# Patient Record
Sex: Female | Born: 1960 | Race: White | Hispanic: No | Marital: Single | Smoking: Current every day smoker
Health system: Southern US, Community
[De-identification: ages and names within clinical notes are randomized; demographics above are authoritative.]

## PROBLEM LIST (undated history)

## (undated) DIAGNOSIS — I10 Essential (primary) hypertension: Secondary | ICD-10-CM

## (undated) HISTORY — PX: APPENDECTOMY: SHX54

## (undated) HISTORY — PX: NECK SURGERY: SHX720

---

## 1998-06-29 ENCOUNTER — Emergency Department (HOSPITAL_COMMUNITY): Admission: EM | Admit: 1998-06-29 | Discharge: 1998-06-29 | Payer: Self-pay | Admitting: Emergency Medicine

## 2000-05-27 ENCOUNTER — Encounter (INDEPENDENT_AMBULATORY_CARE_PROVIDER_SITE_OTHER): Payer: Self-pay | Admitting: Specialist

## 2000-05-27 ENCOUNTER — Other Ambulatory Visit: Admission: RE | Admit: 2000-05-27 | Discharge: 2000-05-27 | Payer: Self-pay | Admitting: Obstetrics & Gynecology

## 2000-06-24 ENCOUNTER — Ambulatory Visit (HOSPITAL_COMMUNITY): Admission: RE | Admit: 2000-06-24 | Discharge: 2000-06-24 | Payer: Self-pay | Admitting: Obstetrics & Gynecology

## 2000-06-24 ENCOUNTER — Encounter (INDEPENDENT_AMBULATORY_CARE_PROVIDER_SITE_OTHER): Payer: Self-pay

## 2000-09-27 ENCOUNTER — Other Ambulatory Visit: Admission: RE | Admit: 2000-09-27 | Discharge: 2000-09-27 | Payer: Self-pay | Admitting: Obstetrics & Gynecology

## 2000-12-28 ENCOUNTER — Other Ambulatory Visit: Admission: RE | Admit: 2000-12-28 | Discharge: 2000-12-28 | Payer: Self-pay | Admitting: Obstetrics & Gynecology

## 2002-02-27 ENCOUNTER — Encounter: Payer: Self-pay | Admitting: Family Medicine

## 2002-02-27 ENCOUNTER — Encounter: Admission: RE | Admit: 2002-02-27 | Discharge: 2002-02-27 | Payer: Self-pay | Admitting: Family Medicine

## 2003-04-17 ENCOUNTER — Other Ambulatory Visit: Admission: RE | Admit: 2003-04-17 | Discharge: 2003-04-17 | Payer: Self-pay | Admitting: Obstetrics & Gynecology

## 2004-07-02 ENCOUNTER — Other Ambulatory Visit: Admission: RE | Admit: 2004-07-02 | Discharge: 2004-07-02 | Payer: Self-pay | Admitting: Obstetrics & Gynecology

## 2004-10-03 ENCOUNTER — Encounter: Admission: RE | Admit: 2004-10-03 | Discharge: 2004-10-03 | Payer: Self-pay | Admitting: Family Medicine

## 2005-04-30 ENCOUNTER — Encounter: Admission: RE | Admit: 2005-04-30 | Discharge: 2005-04-30 | Payer: Self-pay | Admitting: Family Medicine

## 2005-07-28 ENCOUNTER — Other Ambulatory Visit: Admission: RE | Admit: 2005-07-28 | Discharge: 2005-07-28 | Payer: Self-pay | Admitting: Obstetrics & Gynecology

## 2008-02-28 ENCOUNTER — Encounter: Admission: RE | Admit: 2008-02-28 | Discharge: 2008-02-28 | Payer: Self-pay | Admitting: Family Medicine

## 2009-07-23 ENCOUNTER — Ambulatory Visit: Payer: Self-pay | Admitting: Family Medicine

## 2009-07-23 DIAGNOSIS — M546 Pain in thoracic spine: Secondary | ICD-10-CM

## 2009-07-23 DIAGNOSIS — I10 Essential (primary) hypertension: Secondary | ICD-10-CM | POA: Insufficient documentation

## 2010-10-04 ENCOUNTER — Encounter: Payer: Self-pay | Admitting: Family Medicine

## 2011-01-30 NOTE — Op Note (Signed)
[facility]  Patient:    Kathryn Strong, Kathryn Strong                     MRN: [id number] Proc. Date: 06/24/00 Adm. Date:  [id number] Attending:  Minette Headland                           Operative Report  PREOPERATIVE DIAGNOSES:       1.  History of pelvic endometriosis.                               2.  Recurrent chronic right pelvic pain.                               3.  Previous history of chronic appendicitis,                                   with previous appendectomy.                               4.  Recent negative colposcopy, with suggestion                                   of high-grade lesions.  POSTOPERATIVE DIAGNOSES:      1.  History of pelvic endometriosis.                               2.  Recurrent chronic right pelvic pain.                               3.  Previous history of chronic appendicitis,                                   with previous appendectomy.                               4.  Recent negative colposcopy, with suggestion                                   of high-grade lesions.  OPERATIVE PROCEDURE:          1.  Laparoscopy.                               2.  Uterosacral nerve ablation with bipolar                                   coagulation of uterosacral ligaments;                                   followed by cold  myoconization of cervix                                   and fractional D&C.  SURGEON:                      Freddy Finner, M.D.  ANESTHESIA:                   General endotracheal.  INTRAOPERATIVE BLOOD LOSS:    100 cc or less.  INTRAOPERATIVE COMPLICATIONS: None.  INTRAOPERATIVE FINDINGS:      The uterus itself was enlarged and slightly irregular, most consistent with adenomyosis. There was no active evidence of endometriosis and no apparent significant adhesions within the abdomen.  The patient was admitted on the morning of surgery and brought to the operating room and there placed under  adequate general endotracheal anesthesia.  She was placed in the dorsolithotomy position using the [employer].  Betadine prep of the abdomen, perineum and vagina was carried out in the usual fashion.  The bladder was evacuated with [facility] catheter. Hulka tenaculum was attached to the cervix.  Sterile drapes were applied.  Two small incisions were made; one to the umbilicus and one just above the symphysis.  To the upper incision a 10 mm trocar was introduced while holding on the anterior abdominal wall manually.  Direct inspection revealed adequate placement, with no evidence of injury on entry.  A pneumoperitoneum was allowed to accumulate with carbon dioxide gas.  A 5 mm trocar was placed in the lower incision.  Careful and systematic examination of pelvic and abdominal contents was carried out, as noted above.  The findings were negative, except for enlargement of the uterus.  The uterosacral ligaments were pulled and stretched using the Hulka tenaculum and fulgurated at that junction with the bipolar Carpentier forceps through the operating chamber of the laparoscope.  The intra-abdominal portion of the procedure was then terminated.  Gas was allowed to escape from the abdomen.  A local injection of 0.5% Marcaine was done at the incision sites.  The incisions were closed with interrupted subcuticular sutures of 3-0 Dexon.  Steri-Strips were applied to the lower incision.  Attention was then turned vaginally.  Posterior weighted retractor was placed. Hulka tenaculum was removed.  Jackson retractor was used to retract the anterior and lateral fascial walls.  The cervix was grasped with a sharp single-tooth tenaculum.  Cone biopsy was circumscribed, including the entire squamocolumnar junction; carried to a depth of approximately 1.25 cm into the endocervical canal.  General endocervical curettage was carried out.  The cervix was dilated.  The uterus was sounded to 8 cm.   General thorough endometrial curettage was undertaken.  A total of three specimens were submitted.  The angles of the cervix were sutured with 0 Monocryl in a figure-of-eight fashion.  A figure-of-eight was placed in the anterior and posterior cervical lip.  Surgicel was placed into the endocervical canal.  A single interrupted suture was placed to hold the Surgicel in place from anterior to posterior lip of the cervix.  The procedure was terminated.  Hemostasis was adequate.  The patient was taken to the recovery room in good condition.  She was given Vicodin to be taken for postoperative pain.  She is to have no vaginal entry until followed up in the office in approximately two weeks.  She was given routine outpatient  surgical instructions. DD:  06/24/00 TD:  06/24/00 Job: [id number]

## 2011-03-31 ENCOUNTER — Other Ambulatory Visit: Payer: Self-pay | Admitting: Obstetrics & Gynecology

## 2011-03-31 DIAGNOSIS — N6322 Unspecified lump in the left breast, upper inner quadrant: Secondary | ICD-10-CM

## 2011-04-03 ENCOUNTER — Ambulatory Visit
Admission: RE | Admit: 2011-04-03 | Discharge: 2011-04-03 | Disposition: A | Discharge status: Home / Self Care | Payer: BC Managed Care – PPO | Source: Ambulatory Visit | Attending: Obstetrics & Gynecology | Admitting: Obstetrics & Gynecology

## 2011-04-03 ENCOUNTER — Other Ambulatory Visit: Payer: Self-pay | Admitting: Obstetrics & Gynecology

## 2011-04-03 DIAGNOSIS — N6322 Unspecified lump in the left breast, upper inner quadrant: Secondary | ICD-10-CM

## 2011-04-03 DIAGNOSIS — N6311 Unspecified lump in the right breast, upper outer quadrant: Secondary | ICD-10-CM

## 2013-06-21 ENCOUNTER — Ambulatory Visit
Admission: RE | Admit: 2013-06-21 | Discharge: 2013-06-21 | Disposition: A | Discharge status: Home / Self Care | Payer: BC Managed Care – PPO | Source: Ambulatory Visit | Attending: Family Medicine | Admitting: Family Medicine

## 2013-06-21 ENCOUNTER — Other Ambulatory Visit: Payer: Self-pay | Admitting: Family Medicine

## 2013-06-21 DIAGNOSIS — R05 Cough: Secondary | ICD-10-CM

## 2013-10-24 ENCOUNTER — Other Ambulatory Visit: Payer: Self-pay | Admitting: Family Medicine

## 2013-10-24 ENCOUNTER — Ambulatory Visit
Admission: RE | Admit: 2013-10-24 | Discharge: 2013-10-24 | Disposition: A | Discharge status: Home / Self Care | Payer: BC Managed Care – PPO | Source: Ambulatory Visit | Attending: Family Medicine | Admitting: Family Medicine

## 2013-10-24 DIAGNOSIS — T1490XA Injury, unspecified, initial encounter: Secondary | ICD-10-CM

## 2014-04-26 ENCOUNTER — Encounter (HOSPITAL_COMMUNITY): Payer: Self-pay | Admitting: Emergency Medicine

## 2014-04-26 ENCOUNTER — Emergency Department (HOSPITAL_COMMUNITY)
Admission: EM | Admit: 2014-04-26 | Discharge: 2014-04-26 | Disposition: A | Discharge status: Home / Self Care | Payer: BC Managed Care – PPO | Source: Home / Self Care | Attending: Emergency Medicine | Admitting: Emergency Medicine

## 2014-04-26 DIAGNOSIS — R319 Hematuria, unspecified: Secondary | ICD-10-CM

## 2014-04-26 DIAGNOSIS — M546 Pain in thoracic spine: Secondary | ICD-10-CM

## 2014-04-26 HISTORY — DX: Essential (primary) hypertension: I10

## 2014-04-26 LAB — POCT URINALYSIS DIP (DEVICE)
Bilirubin Urine: NEGATIVE
GLUCOSE, UA: NEGATIVE mg/dL
KETONES UR: NEGATIVE mg/dL
Leukocytes, UA: NEGATIVE
Nitrite: NEGATIVE
Protein, ur: NEGATIVE mg/dL
SPECIFIC GRAVITY, URINE: 1.01 (ref 1.005–1.030)
Urobilinogen, UA: 0.2 mg/dL (ref 0.0–1.0)
pH: 7 (ref 5.0–8.0)

## 2014-04-26 LAB — POCT PREGNANCY, URINE: PREG TEST UR: NEGATIVE

## 2014-04-26 MED ORDER — DICLOFENAC SODIUM 50 MG PO TBEC: 50.0000 mg | DELAYED_RELEASE_TABLET | Freq: Two times a day (BID) | ORAL | Status: AC

## 2014-04-26 MED ORDER — CYCLOBENZAPRINE HCL 5 MG PO TABS
5.0000 mg | ORAL_TABLET | Freq: Three times a day (TID) | ORAL | Status: AC | PRN
Start: 2014-04-26 — End: ?

## 2014-04-26 NOTE — ED Provider Notes (Signed)
CSN: [id number]     Arrival date & time 04/26/14  1122 History   First MD Initiated Contact with Patient 04/26/14 1152     Chief Complaint  Patient presents with  . Back Pain   (Consider location/radiation/quality/duration/timing/severity/associated sxs/prior Treatment) HPI Comments: Patient reports abrupt onset of an aching left flank pain on 04/24/2014. Reports the pain to have been intense and "like a spasm." Resolved spontaneously on that day and then reports a second and third episode of same over past 48 hours. States pain radiates around to the front of her left hip. Denies recent injury, fever, nausea, vomiting, diarrhea, hematuria, constipation, rash. Denies chest pain, dyspnea, cough. Patient is a smoker and does not report history of kidney stones. Has not taken medication at home for pain. States she called her PCP office, but was informed her MD was out of office and she did not want to see another provider in the office, so she decided to come here.   The history is provided by the patient.    Past Medical History  Diagnosis Date  . Hypertension    Past Surgical History  Procedure Laterality Date  . Appendectomy    . Cesarean section    . Neck surgery     No family history on file. History  Substance Use Topics  . Smoking status: Current Every Day Smoker  . Smokeless tobacco: Not on file  . Alcohol Use: Yes   OB History   Grav Para Term Preterm Abortions TAB SAB Ect Mult Living                 Review of Systems  Constitutional: Negative.   HENT: Negative.   Eyes: Negative.   Respiratory: Negative.   Cardiovascular: Negative.   Gastrointestinal: Negative.   Endocrine: Negative for polydipsia, polyphagia and polyuria.  Genitourinary: Positive for flank pain. Negative for dysuria, urgency, frequency, hematuria, decreased urine volume, vaginal bleeding, vaginal discharge, enuresis, difficulty urinating, vaginal pain, pelvic pain and dyspareunia.  Musculoskeletal:  Positive for back pain.  Skin: Negative.   Neurological: Negative.     Allergies  Sulfonamide derivatives  Home Medications   Prior to Admission medications   Medication Sig Start Date End Date Taking? Authorizing Provider  ibuprofen (ADVIL,MOTRIN) 200 MG tablet Take 200 mg by mouth every 6 (six) hours as needed.   Yes Historical Provider, MD  LOSARTAN POTASSIUM PO Take by mouth.   Yes Historical Provider, MD  cyclobenzaprine (FLEXERIL) 5 MG tablet Take 1 tablet (5 mg total) by mouth 3 (three) times daily as needed for muscle spasms. 04/26/14   Mathis FareJennifer Lee H Truth Barot, PA  diclofenac (VOLTAREN) 50 MG EC tablet Take 1 tablet (50 mg total) by mouth 2 (two) times daily. As needed for pain 04/26/14   Jess BartersJennifer Lee H Mart Colpitts, PA   BP 158/75  Pulse 78  Temp(Src) 98.4 F (36.9 C) (Oral)  Resp 16  SpO2 96% Physical Exam  Nursing note and vitals reviewed. Constitutional: She is oriented to person, place, and time. She appears well-developed and well-nourished.  HENT:  Head: Normocephalic and atraumatic.  Eyes: Conjunctivae are normal. No scleral icterus.  Neck: Normal range of motion. Neck supple.  Cardiovascular: Normal rate, regular rhythm and normal heart sounds.   Pulmonary/Chest: Effort normal and breath sounds normal. No respiratory distress. She has no wheezes.  Abdominal: Soft. Normal appearance and bowel sounds are normal. There is no tenderness. There is no CVA tenderness.  Musculoskeletal: Normal range of motion.  Back:  CSM exam of both lower extremities is normal.   Neurological: She is alert and oriented to person, place, and time.  Skin: Skin is warm and dry. No rash noted. No erythema.  Psychiatric: She has a normal mood and affect. Her behavior is normal.    ED Course  Procedures (including critical care time) Labs Review Labs Reviewed  POCT URINALYSIS DIP (DEVICE) - Abnormal; Notable for the following:    Hgb urine dipstick TRACE (*)    All other components  within normal limits  POCT PREGNANCY, URINE    Imaging Review No results found.   MDM   1. BACK PAIN, THORACIC REGION, LEFT   2. Hematuria    Exam unremarkable. UPT negative. UA with hematuria. With hematuria (without evidence of infection), patient may have experienced recent passage of kidney stone. Advised to strain all urine over next few days, take flexeril and diclofenac as prescribed and follow up with PCP if no improvement. Patient voices understanding regarding urgent follow up if fever, gross hematuria, chest pain, dyspnea or development of blistering rash.   Ria Clock, [place] 04/26/14 [phone]

## 2014-04-26 NOTE — ED Notes (Signed)
Back pain onset Tuesday 8/11.  Unknown injury.  Pain in middle of back and then radiates around left side, lower ribcage.  Patient reports pain is constant, but has had 3 episodes of severe pain that doubbled her over.  Left ear feels stuffy and has been so for 1 1/2 weeks

## 2014-04-26 NOTE — Discharge Instructions (Signed)
Strain all urine. Medication as prescribed. If pain becomes suddenly worse or severe, you develop fever, visible blood in your urine or are unable pass your urine, report to your nearest ER for evaluation. If symptoms do not improve, please follow up with your doctor.  Back Pain, Adult Low back pain is very common. About 1 in 5 people have back pain.The cause of low back pain is rarely dangerous. The pain often gets better over time.About half of people with a sudden onset of back pain feel better in just 2 weeks. About 8 in 10 people feel better by 6 weeks.  CAUSES Some common causes of back pain include:  Strain of the muscles or ligaments supporting the spine.  Wear and tear (degeneration) of the spinal discs.  Arthritis.  Direct injury to the back. DIAGNOSIS Most of the time, the direct cause of low back pain is not known.However, back pain can be treated effectively even when the exact cause of the pain is unknown.Answering your caregiver's questions about your overall health and symptoms is one of the most accurate ways to make sure the cause of your pain is not dangerous. If your caregiver needs more information, he or she may order lab work or imaging tests (X-rays or MRIs).However, even if imaging tests show changes in your back, this usually does not require surgery. HOME CARE INSTRUCTIONS For many people, back pain returns.Since low back pain is rarely dangerous, it is often a condition that people can learn to [facility] LLCmanageon their own.   Remain active. It is stressful on the back to sit or stand in one place. Do not sit, drive, or stand in one place for more than 30 minutes at a time. Take short walks on level surfaces as soon as pain allows.Try to increase the length of time you walk each day.  Do not stay in bed.Resting more than 1 or 2 days can delay your recovery.  Do not avoid exercise or work.Your body is made to move.It is not dangerous to be active, even though your  back may hurt.Your back will likely heal faster if you return to being active before your pain is gone.  Pay attention to your body when you bend and lift. Many people have less discomfortwhen lifting if they bend their knees, keep the load close to their bodies,and avoid twisting. Often, the most comfortable positions are those that put less stress on your recovering back.  Find a comfortable position to sleep. Use a firm mattress and lie on your side with your knees slightly bent. If you lie on your back, put a pillow under your knees.  Only take over-the-counter or prescription medicines as directed by your caregiver. Over-the-counter medicines to reduce pain and inflammation are often the most helpful.Your caregiver may prescribe muscle relaxant drugs.These medicines help dull your pain so you can more quickly return to your normal activities and healthy exercise.  Put ice on the injured area.  Put ice in a plastic bag.  Place a towel between your skin and the bag.  Leave the ice on for 15-20 minutes, 03-04 times a day for the first 2 to 3 days. After that, ice and heat may be alternated to reduce pain and spasms.  Ask your caregiver about trying back exercises and gentle massage. This may be of some benefit.  Avoid feeling anxious or stressed.Stress increases muscle tension and can worsen back pain.It is important to recognize when you are anxious or stressed and learn ways to  manage it.Exercise is a great option. SEEK MEDICAL CARE IF:  You have pain that is not relieved with rest or medicine.  You have pain that does not improve in 1 week.  You have new symptoms.  You are generally not feeling well. SEEK IMMEDIATE MEDICAL CARE IF:   You have pain that radiates from your back into your legs.  You develop new bowel or bladder control problems.  You have unusual weakness or numbness in your arms or legs.  You develop nausea or vomiting.  You develop abdominal  pain.  You feel faint. Document Released: 08/31/2005 Document Revised: 03/01/2012 Document Reviewed: 01/02/2014 [facility] Patient Information 2015 [place], [place]. This information is not intended to replace advice given to you by your health care provider. Make sure you discuss any questions you have with your health care provider.  Hematuria Hematuria is blood in your urine. It can be caused by a bladder infection, kidney infection, prostate infection, kidney stone, or cancer of your urinary tract. Infections can usually be treated with medicine, and a kidney stone usually will pass through your urine. If neither of these is the cause of your hematuria, further workup to find out the reason may be needed. It is very important that you tell your health care provider about any blood you see in your urine, even if the blood stops without treatment or happens without causing pain. Blood in your urine that happens and then stops and then happens again can be a symptom of a very serious condition. Also, pain is not a symptom in the initial stages of many urinary cancers. HOME CARE INSTRUCTIONS   Drink lots of fluid, 3-4 quarts a day. If you have been diagnosed with an infection, cranberry juice is especially recommended, in addition to large amounts of water.  Avoid caffeine, tea, and carbonated beverages because they tend to irritate the bladder.  Avoid alcohol because it may irritate the prostate.  Take all medicines as directed by your health care provider.  If you were prescribed an antibiotic medicine, finish it all even if you start to feel better.  If you have been diagnosed with a kidney stone, follow your health care provider's instructions regarding straining your urine to catch the stone.  Empty your bladder often. Avoid holding urine for long periods of time.  After a bowel movement, women should cleanse front to back. Use each tissue only once.  Empty your bladder before and after  sexual intercourse if you are a female. SEEK MEDICAL CARE IF:  You develop back pain.  You have a fever.  You have a feeling of sickness in your stomach (nausea) or vomiting.  Your symptoms are not better in 3 days. Return sooner if you are getting worse. SEEK IMMEDIATE MEDICAL CARE IF:   You develop severe vomiting and are unable to keep the medicine down.  You develop severe back or abdominal pain despite taking your medicines.  You begin passing a large amount of blood or clots in your urine.  You feel extremely weak or faint, or you pass out. MAKE SURE YOU:   Understand these instructions.  Will watch your condition.  Will get help right away if you are not doing well or get worse. Document Released: 08/31/2005 Document Revised: 01/15/2014 Document Reviewed: 05/01/2013 [facility] Patient Information 2015 [place], [place]. This information is not intended to replace advice given to you by your health care provider. Make sure you discuss any questions you have with your health care provider.

## 2014-04-27 NOTE — ED Provider Notes (Signed)
Medical screening examination/treatment/procedure(s) were performed by non-physician practitioner and as supervising physician I was immediately available for consultation/collaboration.  Brecken Dewoody, M.D.  Arnie Maiolo C Dyshon Philbin, MD 04/27/14 2147 

## 2014-05-09 ENCOUNTER — Other Ambulatory Visit: Payer: Self-pay | Admitting: Family Medicine

## 2014-05-09 DIAGNOSIS — R109 Unspecified abdominal pain: Secondary | ICD-10-CM

## 2014-05-10 ENCOUNTER — Ambulatory Visit
Admission: RE | Admit: 2014-05-10 | Discharge: 2014-05-10 | Disposition: A | Discharge status: Home / Self Care | Payer: BC Managed Care – PPO | Source: Ambulatory Visit | Attending: Family Medicine | Admitting: Family Medicine

## 2014-05-10 DIAGNOSIS — R109 Unspecified abdominal pain: Secondary | ICD-10-CM

## 2015-01-16 ENCOUNTER — Emergency Department (INDEPENDENT_AMBULATORY_CARE_PROVIDER_SITE_OTHER)
Admission: EM | Admit: 2015-01-16 | Discharge: 2015-01-16 | Disposition: A | Discharge status: Home / Self Care | Payer: BLUE CROSS/BLUE SHIELD | Source: Home / Self Care | Attending: Family Medicine | Admitting: Family Medicine

## 2015-01-16 ENCOUNTER — Encounter: Payer: Self-pay | Admitting: *Deleted

## 2015-01-16 DIAGNOSIS — J309 Allergic rhinitis, unspecified: Secondary | ICD-10-CM | POA: Diagnosis not present

## 2015-01-16 DIAGNOSIS — H1012 Acute atopic conjunctivitis, left eye: Secondary | ICD-10-CM | POA: Diagnosis not present

## 2015-01-16 DIAGNOSIS — J3089 Other allergic rhinitis: Principal | ICD-10-CM

## 2015-01-16 DIAGNOSIS — J302 Other seasonal allergic rhinitis: Principal | ICD-10-CM

## 2015-01-16 MED ORDER — OLOPATADINE HCL 0.1 % OP SOLN: 1.0000 [drp] | Freq: Two times a day (BID) | OPHTHALMIC | Status: AC

## 2015-01-16 NOTE — ED Notes (Signed)
Pt c/o LT eye redness, itching, and burning x 1 day. Green and yellow d/c this morning. Friend with pink eye. Denies fever.

## 2015-01-16 NOTE — Discharge Instructions (Signed)
Eye Drops Use eye drops as directed. It may be easier to have someone help you put the drops in your eye. If you are alone, use the following instructions to help you.  Wash your hands before putting drops in your eyes.  Read the label and look at your medication. Check for any expiration date that may appear on the bottle or tube. Changes of color may be a warning that the medication is old or ineffective. This is especially true if the medication has become brown in color. If you have questions or concerns, call your caregiver. DROPS  Tilt your head back with the affected eye uppermost. Gently pull down on your lower lid. Do not pull up on the upper lid.  Look up. Place the dropper or bottle just over the edge of the lower lid near the white portion at the bottom of the eye. The goal is to have the drop go into the little sac formed by the lower lid and the bottom of the eye itself. Do not release the drop from a height of several inches over the eye. That will only serve to startle the person receiving the medicine when it lands and forces a blink.  Steady your hand in a comfortable manner. An example would be to hold the dropper or bottle between your thumb and index (pointing) finger. Lean your index finger against the brow.  Then, slowly and gently squeeze one drop of medication into your eye.  Once the medication has been applied, place your finger between the lower eyelid and the nose, pressing firmly against the nose for 5-10 seconds. This will slow the process of the eye drop entering the small canal that normally drains tears into the nose, and therefore increases the exposure of the medicine to the eye for a few extra seconds. OINTMENTS  Look up. Place the tip of the tube just over the edge of the lower lid near the white portion at the bottom of the eye. The goal is to create a line of ointment along the inner surface of the eyelid in the little sac formed by the lower lid and the  bottom of the eye itself.  Avoid touching the tube tip to your eyeball or eyelid. This avoids contamination of the tube or the medicine in the tube.  Once a line of medicine has been created, hold the upper lid up and look down before releasing the upper lid. This will force the ointment to spread over the surface of the eye.  Your vision will be very blurry for a few minutes after applying an ointment properly. This is normal and will clear as you continue to blink. For this reason, it is best to apply ointments just before going to sleep, or at a time when you can rest your eyes for 5-10 minutes after applying the medication. GENERAL  Store your medicine in a cool, dry place after each use.  If you need a second medication, wait at least two minutes. This helps the first medication to be taken up (absorbed) by the eye.  If you have been instructed to use both an eye drop and an eye ointment, always apply the drop first and then the ointment 3-4 minutes afterward. Never put medications into the eye unless the label reads, "For Ophthalmic Use," "For Use In Eyes" or "Eye Drops." If you have questions, call your caregiver. Document Released: 12/07/2000 Document Revised: 01/15/2014 Document Reviewed: 02/12/2009 [employer] Patient Information 2015 [employer]. This   information is not intended to replace advice given to you by your health care provider. Make sure you discuss any questions you have with your health care provider.  

## 2015-01-16 NOTE — ED Provider Notes (Signed)
CSN: [id number]     Arrival date & time 01/16/15  1632 History   First MD Initiated Contact with Patient 01/16/15 1700     Chief Complaint  Patient presents with  . Eye Problem      HPI Comments: Patient awoke yesterday with itching/burning in her left eye and increased lacrimation.  She had a small amount of green/yellow discharge this morning.  No foreign body sensation.  No change in vision. She has a history of seasonal allergies controlled with loratadine.  Patient is a 54 y.o. female presenting with eye problem. The history is provided by the patient.  Eye Problem Location:  L eye Quality:  Burning Severity:  Mild Onset quality:  Sudden Duration:  2 days Timing:  Constant Progression:  Unchanged Chronicity:  New Context comment:  Seasonal allergies Relieved by:  None tried Worsened by:  Nothing tried Ineffective treatments:  None tried Associated symptoms: crusting, discharge, itching, redness and tearing   Associated symptoms: no blurred vision, no decreased vision, no double vision, no facial rash, no foreign body sensation, no headaches, no inflammation, no photophobia, no scotomas, no swelling and no tingling   Risk factors: exposure to pinkeye   Risk factors: no recent URI     Past Medical History  Diagnosis Date  . Hypertension    Past Surgical History  Procedure Laterality Date  . Appendectomy    . Cesarean section    . Neck surgery     Family History  Problem Relation Age of Onset  . Cancer Mother     colon  . Stroke Mother    History  Substance Use Topics  . Smoking status: Current Every Day Smoker  . Smokeless tobacco: Not on file  . Alcohol Use: Yes   OB History    No data available     Review of Systems  Eyes: Positive for discharge, redness and itching. Negative for blurred vision, double vision and photophobia.  Neurological: Negative for tingling and headaches.  All other systems reviewed and are negative.   Allergies  Advair diskus  and Sulfonamide derivatives  Home Medications   Prior to Admission medications   Medication Sig Start Date End Date Taking? Authorizing Provider  phentermine 37.5 MG capsule Take 37.5 mg by mouth every morning.   Yes Historical Provider, MD  cyclobenzaprine (FLEXERIL) 5 MG tablet Take 1 tablet (5 mg total) by mouth 3 (three) times daily as needed for muscle spasms. 04/26/14   Mathis FareJennifer Lee H Presson, PA  diclofenac (VOLTAREN) 50 MG EC tablet Take 1 tablet (50 mg total) by mouth 2 (two) times daily. As needed for pain 04/26/14   Mathis FareJennifer Lee H Presson, PA  ibuprofen (ADVIL,MOTRIN) 200 MG tablet Take 200 mg by mouth every 6 (six) hours as needed.    Historical Provider, MD  LOSARTAN POTASSIUM PO Take by mouth.    Historical Provider, MD  olopatadine (PATANOL) 0.1 % ophthalmic solution Place 1 drop into the left eye 2 (two) times daily. 01/16/15   Lattie HawStephen A Jaykub Mackins, MD   BP 116/77 mmHg  Pulse 86  Temp(Src) 98 F (36.7 C) (Oral)  Resp 16  Ht 5\' 2"  (1.575 m)  Wt 175 lb (79.379 kg)  BMI 32.00 kg/m2  SpO2 97%  LMP 03/10/2011 Physical Exam  Constitutional: She appears well-developed and well-nourished. No distress.  Patient is obese (BMI 32.0)  HENT:  Head: Normocephalic.  Right Ear: Tympanic membrane normal.  Left Ear: Tympanic membrane normal.  Nose: Mucosal edema present.  No rhinorrhea, sinus tenderness, nasal deformity or septal deviation.  Mouth/Throat: Oropharynx is clear and moist.  Eyes: Conjunctivae, EOM and lids are normal. Pupils are equal, round, and reactive to light. Lids are everted and swept, no foreign bodies found. Left eye exhibits no chemosis, no discharge, no exudate and no hordeolum. No foreign body present in the left eye. No scleral icterus.  No eyelid tenderness, swelling, or erythema  Neck: Neck supple.  Lymphadenopathy:    She has no cervical adenopathy.  Nursing note and vitals reviewed.   ED Course  Procedures  none   MDM   1. 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    Lattie HawStephen A Micajah Dennin, MD 01/16/15 [phone]

## 2015-05-16 IMAGING — CT CT ABD-PELV W/O CM
2 of 4 series · 13 of 36 positions shown, 19 images · non-contrast
Comparison: Lung bases are normal.

CLINICAL DATA: Left flank pain 2 weeks. Microscopic hematuria.
Prior appendectomy.

EXAM:
CT ABDOMEN AND PELVIS WITHOUT CONTRAST
TECHNIQUE: Multidetector CT imaging of the abdomen and pelvis was performed
following the standard protocol without IV contrast.

[Series 601: coronal body · coronal · 0.86mm/px · 1 of 127 slices shown, 2 images]
[im 43/127  soft-tissue]
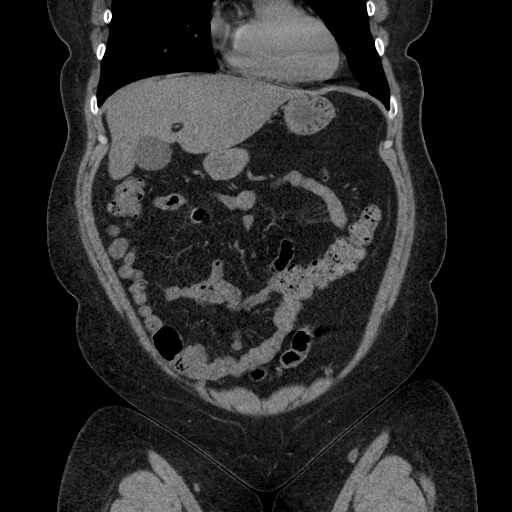
[im 43/127  bone]
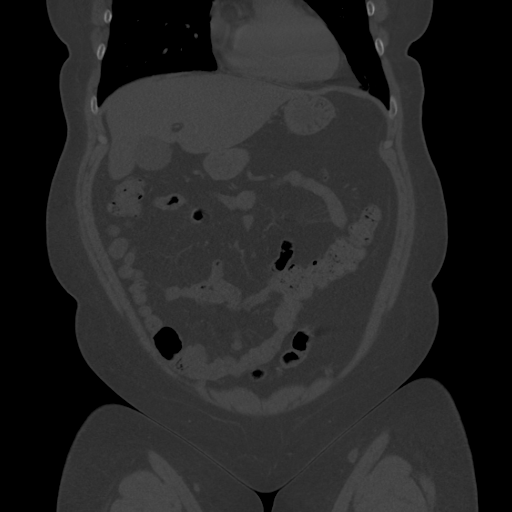

[Series 602: sagittal body · sagittal · 0.86mm/px · 12 of 161 slices shown, 17 images]
[im 9/161  soft-tissue]
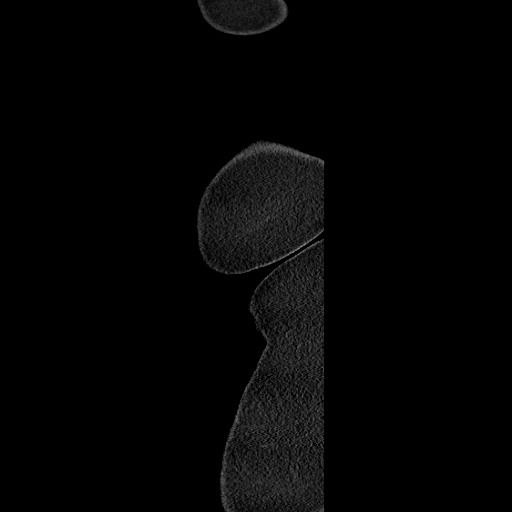
[im 9/161  lung]
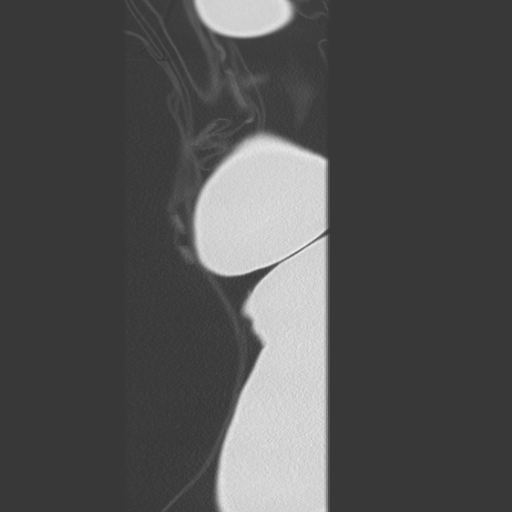
[im 9/161  bone]
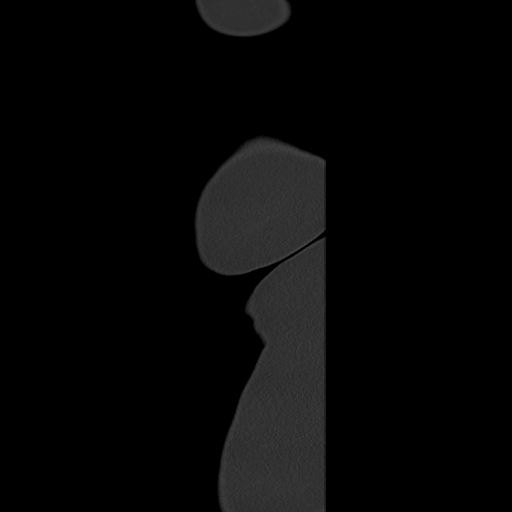
[im 18/161  lung]
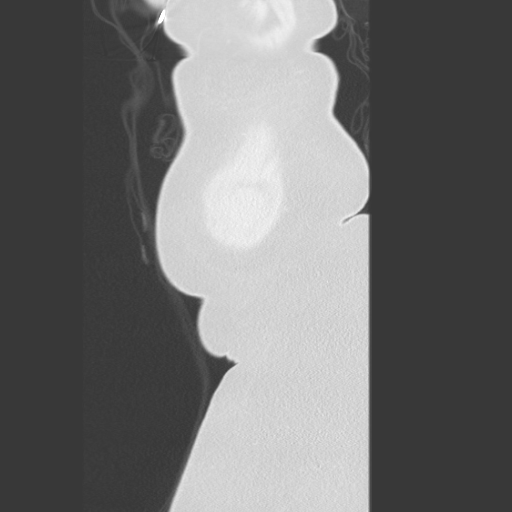
[im 27/161  soft-tissue]
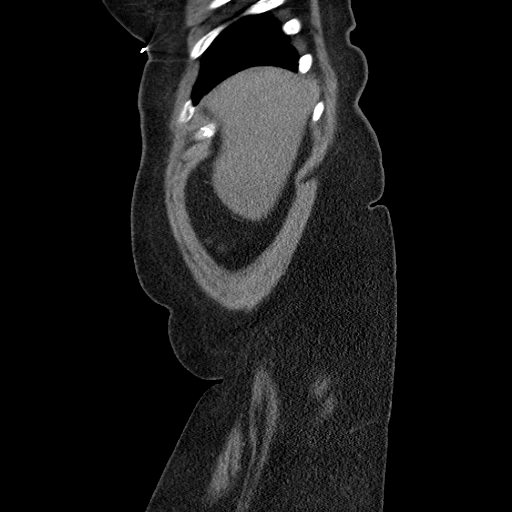
[im 27/161  lung]
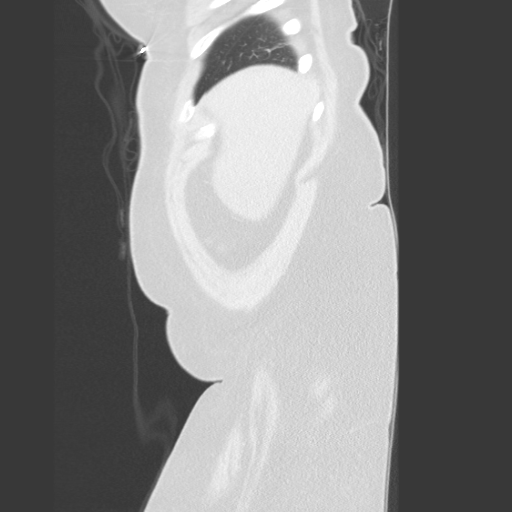
[im 36/161  soft-tissue]
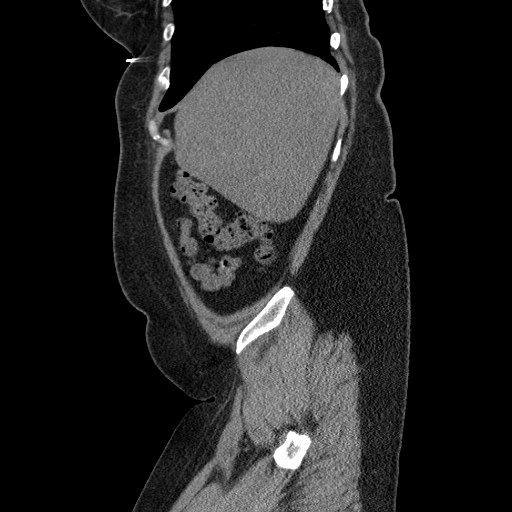
[im 36/161  lung]
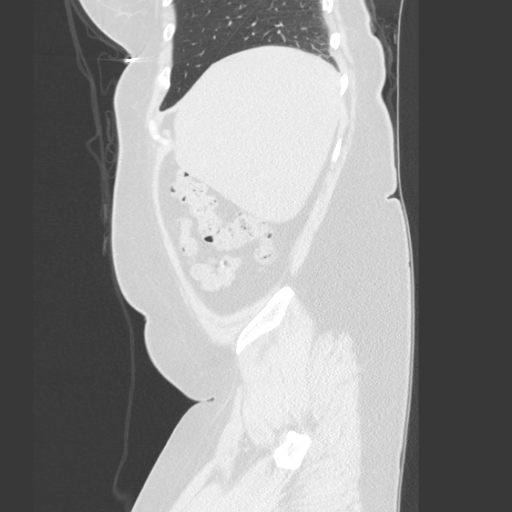
[im 54/161  soft-tissue]
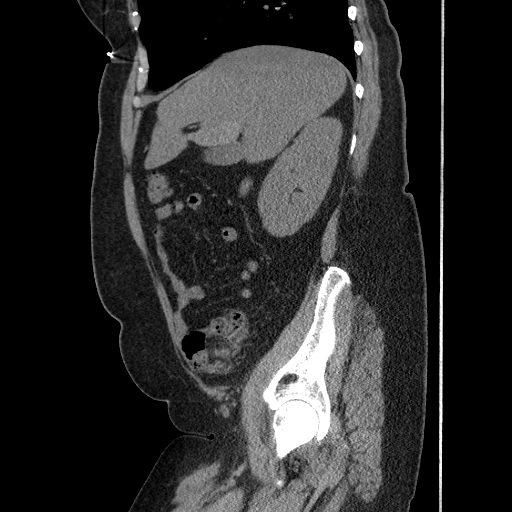
[im 63/161  soft-tissue]
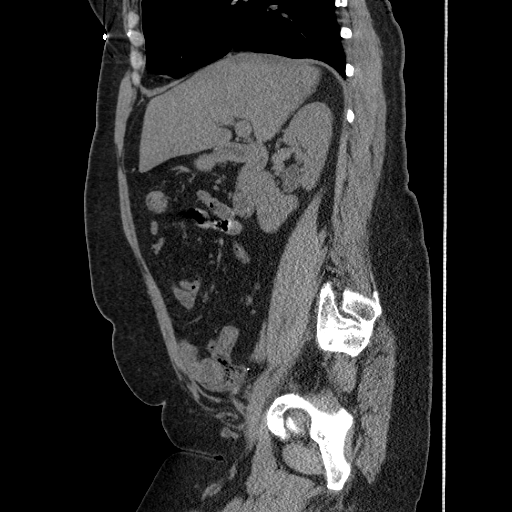
[im 81/161  soft-tissue]
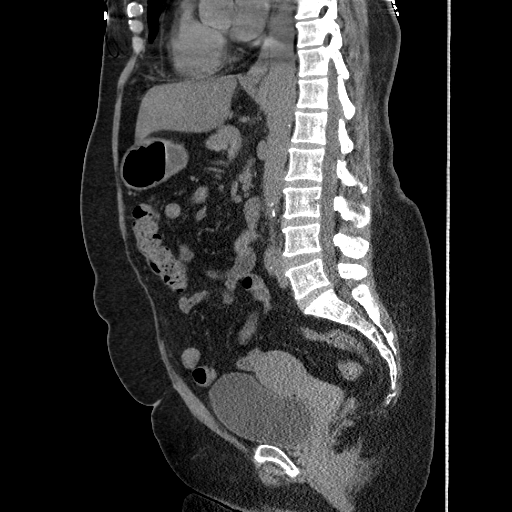
[im 98/161  soft-tissue]
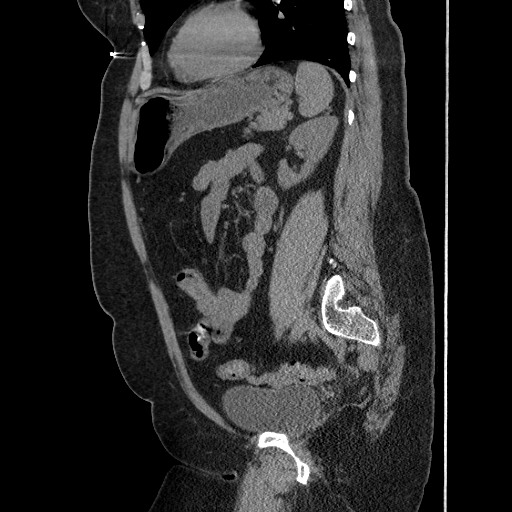
[im 107/161  soft-tissue]
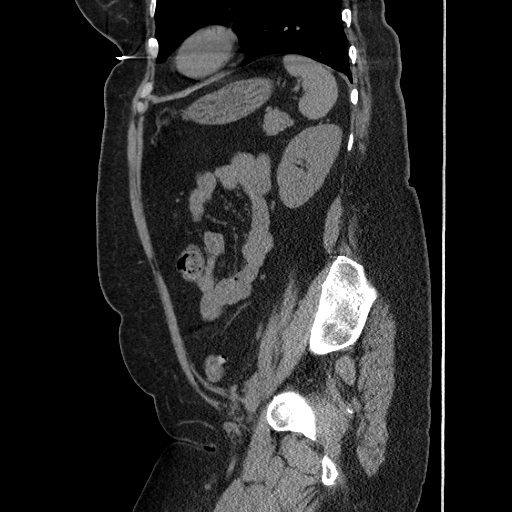
[im 125/161  soft-tissue]
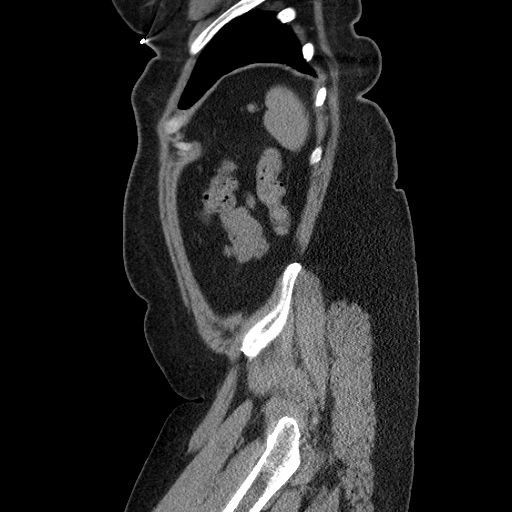
[im 125/161  bone]
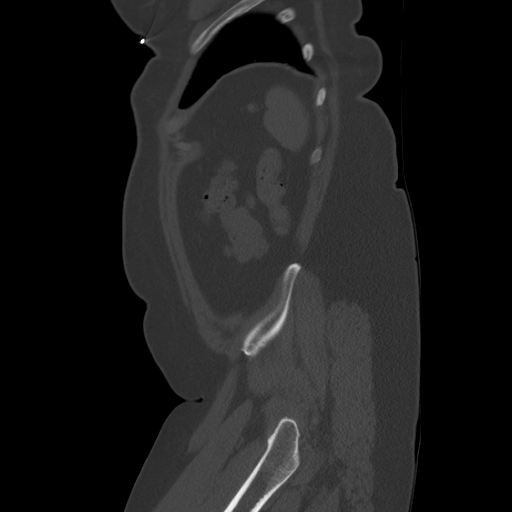
[im 134/161  soft-tissue]
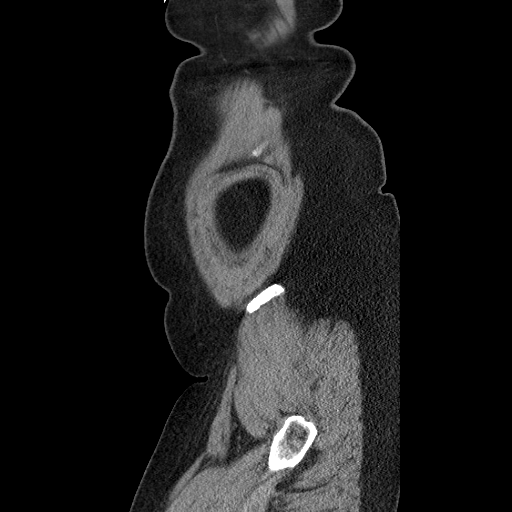
[im 152/161  soft-tissue]
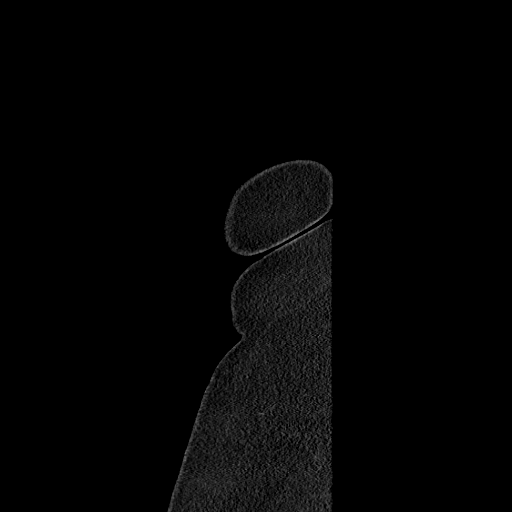

[13 of 36 positions shown; findings below may reference images not displayed]

Abdominal images demonstrate a normal liver, spleen, pancreas,
gallbladder and adrenal glands. Kidneys are normal in size without
hydronephrosis or nephrolithiasis. There is minimal prominence of
the right renal pelvis and ureter compared to the left. There is no
perinephric inflammation or fluid. There are no definite ureteral
stones. There is a 2 mm calcification over the right pelvis which
appears to the outside the distal right ureter. There is mild
atherosclerotic plaque of the abdominal aorta. There is minimal
diverticulosis of the colon. There is evidence of patient's prior
appendectomy.

Pelvic images demonstrate a normal uterus, right ovary, bladder and
rectum. Left ovary not visualized. Remainder the exam is
unremarkable.
FINDINGS: Possible nonobstructive 1-2 mm stone over the mid to lower pole
collecting system of the right kidney. No definite ureteral stones
or hydronephrosis. Otherwise, no acute findings in the
abdomen/pelvis.

Mild diverticulosis of the colon.

## 2021-01-16 DIAGNOSIS — E782 Mixed hyperlipidemia: Secondary | ICD-10-CM | POA: Diagnosis not present

## 2021-01-16 DIAGNOSIS — R7303 Prediabetes: Secondary | ICD-10-CM | POA: Diagnosis not present

## 2021-01-16 DIAGNOSIS — Z0000 Encounter for general adult medical examination without abnormal findings: Secondary | ICD-10-CM | POA: Diagnosis not present

## 2021-01-16 DIAGNOSIS — I10 Essential (primary) hypertension: Secondary | ICD-10-CM | POA: Diagnosis not present

## 2021-01-23 ENCOUNTER — Emergency Department (HOSPITAL_COMMUNITY)
Admission: EM | Admit: 2021-01-23 | Discharge: 2021-01-24 | Disposition: A | Discharge status: Home / Self Care | Payer: BC Managed Care – PPO | Attending: Physician Assistant | Admitting: Physician Assistant

## 2021-01-23 ENCOUNTER — Other Ambulatory Visit: Payer: Self-pay

## 2021-01-23 DIAGNOSIS — R3981 Functional urinary incontinence: Secondary | ICD-10-CM | POA: Insufficient documentation

## 2021-01-23 DIAGNOSIS — R55 Syncope and collapse: Secondary | ICD-10-CM | POA: Insufficient documentation

## 2021-01-23 DIAGNOSIS — R002 Palpitations: Secondary | ICD-10-CM | POA: Diagnosis not present

## 2021-01-23 DIAGNOSIS — Z5321 Procedure and treatment not carried out due to patient leaving prior to being seen by health care provider: Secondary | ICD-10-CM | POA: Insufficient documentation

## 2021-01-23 DIAGNOSIS — R112 Nausea with vomiting, unspecified: Secondary | ICD-10-CM | POA: Insufficient documentation

## 2021-01-23 DIAGNOSIS — R251 Tremor, unspecified: Secondary | ICD-10-CM | POA: Insufficient documentation

## 2021-01-23 DIAGNOSIS — R42 Dizziness and giddiness: Secondary | ICD-10-CM | POA: Insufficient documentation

## 2021-01-23 DIAGNOSIS — R079 Chest pain, unspecified: Secondary | ICD-10-CM | POA: Diagnosis not present

## 2021-01-23 LAB — CBC WITH DIFFERENTIAL/PLATELET
Abs Immature Granulocytes: 0.03 10*3/uL (ref 0.00–0.07)
Basophils Absolute: 0 10*3/uL (ref 0.0–0.1)
Basophils Relative: 0 %
Eosinophils Absolute: 0.1 10*3/uL (ref 0.0–0.5)
Eosinophils Relative: 1 %
HCT: 45.4 % (ref 36.0–46.0)
Hemoglobin: 15.5 g/dL — ABNORMAL HIGH (ref 12.0–15.0)
Immature Granulocytes: 0 %
Lymphocytes Relative: 21 %
Lymphs Abs: 1.9 10*3/uL (ref 0.7–4.0)
MCH: 30.9 pg (ref 26.0–34.0)
MCHC: 34.1 g/dL (ref 30.0–36.0)
MCV: 90.6 fL (ref 80.0–100.0)
Monocytes Absolute: 0.7 10*3/uL (ref 0.1–1.0)
Monocytes Relative: 7 %
Neutro Abs: 6.7 10*3/uL (ref 1.7–7.7)
Neutrophils Relative %: 71 %
Platelets: 195 10*3/uL (ref 150–400)
RBC: 5.01 MIL/uL (ref 3.87–5.11)
RDW: 12.9 % (ref 11.5–15.5)
WBC: 9.5 10*3/uL (ref 4.0–10.5)
nRBC: 0 % (ref 0.0–0.2)

## 2021-01-23 LAB — BASIC METABOLIC PANEL
Anion gap: 10 (ref 5–15)
BUN: 11 mg/dL (ref 6–20)
CO2: 26 mmol/L (ref 22–32)
Calcium: 9.4 mg/dL (ref 8.9–10.3)
Chloride: 97 mmol/L — ABNORMAL LOW (ref 98–111)
Creatinine, Ser: 0.6 mg/dL (ref 0.44–1.00)
GFR, Estimated: >60 mL/min (ref >60)
Glucose, Bld: 113 mg/dL — ABNORMAL HIGH (ref 70–99)
Potassium: 3.5 mmol/L (ref 3.5–5.1)
Sodium: 133 mmol/L — ABNORMAL LOW (ref 135–145)

## 2021-01-23 NOTE — ED Notes (Signed)
Patient called for vitals over several hours. No reply.

## 2021-01-23 NOTE — ED Triage Notes (Signed)
Pt states she was sitting at her desk at work and she passed out. Pt states she was incontinent of urine, had tremors, and vomited. Pt states she had nausea and "wavy eyed vision" earlier and thought she was having a migraine. Has not had a headache. Pt states she feels lightheaded and nauseated now. Denies pain.

## 2021-01-23 NOTE — ED Provider Notes (Signed)
Emergency Medicine Provider Triage Evaluation Note  Kathryn Strong , a 60 y.o. female  was evaluated in triage.  Pt complains of syncope. Pt states she was sitting at her desk and had a syncopal episode. She also had an episode of urinary incontinence. She is not sure if she had a prodrome. She does report palpitations. Denies chest pain, sob. She reports some nausea currently.   Review of Systems  Positive: Syncope, palpitations Negative: Chest pain, sob  Physical Exam  LMP 03/10/2011  Gen:   Awake, no distress   Resp:  Normal effort  MSK:   Moves extremities without difficulty  Other:  Heart with rrr, lungs ctab, clear speech  Medical Decision Making  Medically screening exam initiated at 4:06 PM.  Appropriate orders placed.  Kathryn Strong was informed that the remainder of the evaluation will be completed by another provider, this initial triage assessment does not replace that evaluation, and the importance of remaining in the ED until their evaluation is complete.   [name], PA-C 01/23/21 1612    Derwood Kaplan, MD 01/24/21 1541

## 2021-01-24 DIAGNOSIS — R519 Headache, unspecified: Secondary | ICD-10-CM | POA: Diagnosis not present

## 2021-01-24 DIAGNOSIS — R55 Syncope and collapse: Secondary | ICD-10-CM | POA: Diagnosis not present

## 2021-01-24 DIAGNOSIS — R9431 Abnormal electrocardiogram [ECG] [EKG]: Secondary | ICD-10-CM | POA: Diagnosis not present

## 2021-02-13 DIAGNOSIS — G43909 Migraine, unspecified, not intractable, without status migrainosus: Secondary | ICD-10-CM | POA: Diagnosis not present

## 2021-02-13 DIAGNOSIS — F17200 Nicotine dependence, unspecified, uncomplicated: Secondary | ICD-10-CM | POA: Diagnosis not present

## 2021-02-13 DIAGNOSIS — K5730 Diverticulosis of large intestine without perforation or abscess without bleeding: Secondary | ICD-10-CM | POA: Diagnosis not present

## 2021-02-13 DIAGNOSIS — Z800 Family history of malignant neoplasm of digestive organs: Secondary | ICD-10-CM | POA: Diagnosis not present

## 2021-02-13 DIAGNOSIS — Z1211 Encounter for screening for malignant neoplasm of colon: Secondary | ICD-10-CM | POA: Diagnosis not present

## 2021-02-13 DIAGNOSIS — R55 Syncope and collapse: Secondary | ICD-10-CM | POA: Diagnosis not present

## 2021-02-13 DIAGNOSIS — Z8371 Family history of colonic polyps: Secondary | ICD-10-CM | POA: Diagnosis not present

## 2021-04-21 DIAGNOSIS — K635 Polyp of colon: Secondary | ICD-10-CM | POA: Diagnosis not present

## 2021-04-21 DIAGNOSIS — Z1211 Encounter for screening for malignant neoplasm of colon: Secondary | ICD-10-CM | POA: Diagnosis not present

## 2021-04-21 DIAGNOSIS — K633 Ulcer of intestine: Secondary | ICD-10-CM | POA: Diagnosis not present

## 2021-04-21 DIAGNOSIS — Z800 Family history of malignant neoplasm of digestive organs: Secondary | ICD-10-CM | POA: Diagnosis not present

## 2021-04-21 DIAGNOSIS — K5000 Crohn's disease of small intestine without complications: Secondary | ICD-10-CM | POA: Diagnosis not present

## 2021-04-21 DIAGNOSIS — K5730 Diverticulosis of large intestine without perforation or abscess without bleeding: Secondary | ICD-10-CM | POA: Diagnosis not present

## 2021-09-26 DIAGNOSIS — Z1231 Encounter for screening mammogram for malignant neoplasm of breast: Secondary | ICD-10-CM | POA: Diagnosis not present

## 2021-09-26 DIAGNOSIS — Z6829 Body mass index (BMI) 29.0-29.9, adult: Secondary | ICD-10-CM | POA: Diagnosis not present

## 2021-09-26 DIAGNOSIS — Z01419 Encounter for gynecological examination (general) (routine) without abnormal findings: Secondary | ICD-10-CM | POA: Diagnosis not present

## 2021-11-27 DIAGNOSIS — Z13820 Encounter for screening for osteoporosis: Secondary | ICD-10-CM | POA: Diagnosis not present

## 2021-11-27 DIAGNOSIS — M8580 Other specified disorders of bone density and structure, unspecified site: Secondary | ICD-10-CM | POA: Diagnosis not present

## 2022-01-26 DIAGNOSIS — R7303 Prediabetes: Secondary | ICD-10-CM | POA: Diagnosis not present

## 2022-01-26 DIAGNOSIS — I10 Essential (primary) hypertension: Secondary | ICD-10-CM | POA: Diagnosis not present

## 2022-01-26 DIAGNOSIS — E782 Mixed hyperlipidemia: Secondary | ICD-10-CM | POA: Diagnosis not present

## 2022-01-26 DIAGNOSIS — Z0000 Encounter for general adult medical examination without abnormal findings: Secondary | ICD-10-CM | POA: Diagnosis not present

## 2022-04-20 DIAGNOSIS — D2262 Melanocytic nevi of left upper limb, including shoulder: Secondary | ICD-10-CM | POA: Diagnosis not present

## 2022-04-20 DIAGNOSIS — D2261 Melanocytic nevi of right upper limb, including shoulder: Secondary | ICD-10-CM | POA: Diagnosis not present

## 2022-04-20 DIAGNOSIS — D225 Melanocytic nevi of trunk: Secondary | ICD-10-CM | POA: Diagnosis not present

## 2022-04-20 DIAGNOSIS — D2272 Melanocytic nevi of left lower limb, including hip: Secondary | ICD-10-CM | POA: Diagnosis not present

## 2023-10-07 ENCOUNTER — Ambulatory Visit
Admission: EM | Admit: 2023-10-07 | Discharge: 2023-10-07 | Disposition: A | Discharge status: Home / Self Care | Payer: Self-pay

## 2023-10-07 ENCOUNTER — Encounter: Payer: Self-pay | Admitting: Emergency Medicine

## 2023-10-07 ENCOUNTER — Ambulatory Visit (INDEPENDENT_AMBULATORY_CARE_PROVIDER_SITE_OTHER): Payer: Self-pay

## 2023-10-07 DIAGNOSIS — J209 Acute bronchitis, unspecified: Secondary | ICD-10-CM

## 2023-10-07 DIAGNOSIS — K5730 Diverticulosis of large intestine without perforation or abscess without bleeding: Secondary | ICD-10-CM | POA: Insufficient documentation

## 2023-10-07 DIAGNOSIS — Z1211 Encounter for screening for malignant neoplasm of colon: Secondary | ICD-10-CM | POA: Insufficient documentation

## 2023-10-07 DIAGNOSIS — R1111 Vomiting without nausea: Secondary | ICD-10-CM | POA: Insufficient documentation

## 2023-10-07 DIAGNOSIS — Z800 Family history of malignant neoplasm of digestive organs: Secondary | ICD-10-CM | POA: Insufficient documentation

## 2023-10-07 DIAGNOSIS — R051 Acute cough: Secondary | ICD-10-CM

## 2023-10-07 DIAGNOSIS — Z83719 Family history of colon polyps, unspecified: Secondary | ICD-10-CM | POA: Insufficient documentation

## 2023-10-07 DIAGNOSIS — K219 Gastro-esophageal reflux disease without esophagitis: Secondary | ICD-10-CM | POA: Insufficient documentation

## 2023-10-07 MED ORDER — PREDNISONE 20 MG PO TABS: 40.0000 mg | ORAL_TABLET | Freq: Every day | ORAL | 0 refills | Status: AC

## 2023-10-07 MED ORDER — AZITHROMYCIN 250 MG PO TABS: 250.0000 mg | ORAL_TABLET | Freq: Every day | ORAL | 0 refills | Status: AC

## 2023-10-07 NOTE — ED Triage Notes (Signed)
Pt presents with SOB, cough, wheezing, and possible fever. Pt believes she has pneumonia. She is having a tough time breathing.

## 2023-10-07 NOTE — ED Provider Notes (Signed)
[facility]    CSN: [id number] Arrival date & time: 10/07/23  1015      History   Chief Complaint Chief Complaint  Patient presents with   Shortness of Breath   Cough    HPI Kathryn Strong is a 63 y.o. female.   Patient here today for evaluation of cough, shortness of breath, wheezing possible fever.  She reports she is concerned with possible pneumonia.  She has not had vomiting or diarrhea.  She has taken over-the-counter medication without resolution.  The history is provided by the patient.  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      Home Medications    Prior to Admission medications   Medication Sig Start Date End Date Taking? Authorizing Provider  Ascorbic Acid (VITAMIN C PO) Vitamin C   Yes [provider]  azithromycin (ZITHROMAX) 250 MG tablet Take 1 tablet (250 mg total) by mouth daily. Take first 2 tablets together, then 1 every day until finished. 10/07/23  Yes Tomi Bamberger, PA-C  Cholecalciferol (VITAMIN D-3 PO) Vitamin D3   Yes [provider]  predniSONE (DELTASONE) 20 MG tablet Take 2 tablets (40 mg total) by mouth daily with breakfast for 5 days. 10/07/23 10/12/23 Yes Tomi Bamberger, PA-C  cyclobenzaprine (FLEXERIL) 5 MG tablet Take 1 tablet (5 mg total) by mouth 3 (three) times daily as needed for muscle spasms. 04/26/14   Presson, Mathis Fare, PA  diclofenac (VOLTAREN) 50 MG EC tablet Take 1 tablet (50 mg total) by mouth 2 (two) times daily. As needed for pain 04/26/14   Presson, Jess Barters H, PA  guaiFENesin (MUCINEX PO) Mucinex    [provider]  ibuprofen (ADVIL) 200 MG tablet Take 200 mg by mouth every 6 (six) hours as needed.    [provider]  LOSARTAN POTASSIUM PO Take by mouth.    [provider]  LOSARTAN POTASSIUM PO Losartan    [provider]  Multiple Vitamins-Minerals (MULTIVITAMIN ADULTS PO) Multivitamin    [provider]  olopatadine (PATANOL) 0.1 % ophthalmic solution Place 1 drop into the left eye 2 (two) times daily. 01/16/15   Lattie Haw, MD  phentermine 37.5 MG capsule Take 37.5 mg by mouth every morning.    [provider]  Sod Picosulfate-Mag Ox-Cit Acd (CLENPIQ) 10-3.5-12 MG-GM -GM/160ML SOLN 160 mL orally 02/13/21   [provider]    Family History Family History  Problem Relation Age of Onset   Cancer Mother  colon   Stroke Mother     Social History Social History   Tobacco Use   Smoking status: Every Day    Passive exposure: Current  Vaping Use   Vaping status: Never Used  Substance Use Topics   Alcohol use: Yes    Comment: Occasionally   Drug use: Never     Allergies   Fluticasone-salmeterol and Sulfonamide derivatives   Review of Systems Review of Systems  Constitutional:  Negative for chills and fever.  HENT:  Positive for congestion. Negative for ear pain and sore throat.   Eyes:  Negative for discharge and redness.  Respiratory:  Positive for cough and shortness of breath. Negative  for wheezing.   Gastrointestinal:  Negative for abdominal pain, diarrhea, nausea and vomiting.     Physical Exam Triage Vital Signs ED Triage Vitals  Encounter Vitals Group     BP 10/07/23 1027 (!) 155/90     Systolic BP Percentile --      Diastolic BP Percentile --      Pulse Rate 10/07/23 1027 96     Resp 10/07/23 1027 (!) 22     Temp 10/07/23 1027 98.5 F (36.9 C)     Temp Source 10/07/23 1027 Oral     SpO2 10/07/23 1027 93 %     Weight 10/07/23 1024 175 lb 0.7 oz (79.4 kg)     Height 10/07/23 1024 5\' 2"  (1.575 m)     Head Circumference --      Peak Flow --      Pain Score 10/07/23 1024 0     Pain Loc --      Pain Education --      Exclude from Growth Chart --    No data found.  Updated Vital Signs BP (!) 155/90 (BP Location: Left Arm)   Pulse 96   Temp 98.5 F (36.9 C) (Oral)   Resp (!) 22   Ht 5\' 2"  (1.575 m)   Wt 175 lb 0.7 oz (79.4 kg)   LMP 03/10/2011   SpO2 93%   BMI 32.02 kg/m   Visual Acuity Right Eye Distance:   Left Eye Distance:   Bilateral Distance:    Right Eye Near:   Left Eye Near:    Bilateral Near:     Physical Exam Vitals and nursing note reviewed.  Constitutional:      General: She is not in acute distress.    Appearance: Normal appearance. She is not ill-appearing.  HENT:     Head: Normocephalic and atraumatic.     Right Ear: Tympanic membrane normal.     Left Ear: Tympanic membrane normal.     Nose: Congestion present.     Mouth/Throat:     Mouth: Mucous membranes are moist.     Pharynx: No oropharyngeal exudate or posterior oropharyngeal erythema.  Eyes:     Conjunctiva/sclera: Conjunctivae normal.  Cardiovascular:     Rate and Rhythm: Normal rate and regular rhythm.     Heart sounds: Normal heart sounds. No murmur heard. Pulmonary:     Effort: Pulmonary effort is normal. No respiratory distress.     Breath sounds: Normal breath sounds. No wheezing, rhonchi or rales.  Skin:    General: Skin is warm and dry.   Neurological:     Mental Status: She is alert.  Psychiatric:        Mood and Affect: Mood normal.        Thought Content: Thought content normal.  UC Treatments / Results  Labs (all labs ordered are listed, but only abnormal results are displayed) Labs Reviewed - No data to display  EKG   Radiology No results found.   Procedures Procedures (including critical care time)  Medications Ordered in UC Medications - No data to display  Initial Impression / Assessment and Plan / UC Course  I have reviewed the triage vital signs and the nursing notes.  Pertinent labs & imaging results that were available during my care of the patient were reviewed by me and considered in my medical decision making (see chart for details).    Chest x-ray without acute findings.  Just concerns for possible emphysema given x-ray report and advise she follow-up with her primary care regarding same.  Suspect likely bronchitis and will treat with steroid burst as well as Z-Pak to cover possible atypical pneumonia.  Advised follow-up if no gradual improvement with any further concerns.  Final Clinical Impressions(s) / UC Diagnoses   Final diagnoses:  Acute cough  Acute bronchitis, unspecified organism   Discharge Instructions   None    ED Prescriptions     Medication Sig Dispense Auth. Provider   predniSONE (DELTASONE) 20 MG tablet Take 2 tablets (40 mg total) by mouth daily with breakfast for 5 days. 10 tablet Erma Pinto F, PA-C   azithromycin (ZITHROMAX) 250 MG tablet Take 1 tablet (250 mg total) by mouth daily. Take first 2 tablets together, then 1 every day until finished. 6 tablet Tomi Bamberger, PA-C      PDMP not reviewed this encounter.   Tomi Bamberger, PA-C 10/10/23 [phone]

## 2023-10-10 ENCOUNTER — Encounter: Payer: Self-pay | Admitting: Physician Assistant
